# Patient Record
Sex: Male | Born: 1950 | Race: White | Hispanic: No | Marital: Single
Health system: Southern US, Community
[De-identification: ages and names within clinical notes are randomized; demographics above are authoritative.]

---

## 2016-03-02 ENCOUNTER — Inpatient Hospital Stay
Admission: RE | Admit: 2016-03-02 | Discharge: 2016-03-24 | Disposition: A | Discharge status: Skilled Nursing Facility | Payer: Self-pay | Source: Ambulatory Visit | Attending: Internal Medicine | Admitting: Internal Medicine

## 2016-03-02 DIAGNOSIS — N2889 Other specified disorders of kidney and ureter: Secondary | ICD-10-CM

## 2016-03-02 DIAGNOSIS — N179 Acute kidney failure, unspecified: Secondary | ICD-10-CM

## 2016-03-02 LAB — COMPREHENSIVE METABOLIC PANEL
ALK PHOS: 64 U/L (ref 38–126)
ALT: 11 U/L — AB (ref 17–63)
AST: 21 U/L (ref 15–41)
Albumin: 2.7 g/dL — ABNORMAL LOW (ref 3.5–5.0)
Anion gap: 8 (ref 5–15)
BUN: 35 mg/dL — AB (ref 6–20)
CALCIUM: 9.1 mg/dL (ref 8.9–10.3)
CO2: 31 mmol/L (ref 22–32)
CREATININE: 3.27 mg/dL — AB (ref 0.61–1.24)
Chloride: 98 mmol/L — ABNORMAL LOW (ref 101–111)
GFR calc Af Amer: 21 mL/min — ABNORMAL LOW (ref >60)
GFR calc non Af Amer: 18 mL/min — ABNORMAL LOW (ref >60)
GLUCOSE: 175 mg/dL — AB (ref 65–99)
Potassium: 4 mmol/L (ref 3.5–5.1)
SODIUM: 137 mmol/L (ref 135–145)
Total Bilirubin: 0.6 mg/dL (ref 0.3–1.2)
Total Protein: 6.4 g/dL — ABNORMAL LOW (ref 6.5–8.1)

## 2016-03-03 LAB — CBC WITH DIFFERENTIAL/PLATELET
BASOS PCT: 1 %
Basophils Absolute: 0.1 10*3/uL (ref 0.0–0.1)
EOS ABS: 0.3 10*3/uL (ref 0.0–0.7)
Eosinophils Relative: 4 %
HCT: 19.9 % — ABNORMAL LOW (ref 39.0–52.0)
Hemoglobin: 5.8 g/dL — CL (ref 13.0–17.0)
LYMPHS ABS: 0.9 10*3/uL (ref 0.7–4.0)
Lymphocytes Relative: 15 %
MCH: 27 pg (ref 26.0–34.0)
MCHC: 29.1 g/dL — AB (ref 30.0–36.0)
MCV: 92.6 fL (ref 78.0–100.0)
MONO ABS: 0.6 10*3/uL (ref 0.1–1.0)
MONOS PCT: 10 %
Neutro Abs: 4.3 10*3/uL (ref 1.7–7.7)
Neutrophils Relative %: 70 %
Platelets: 238 10*3/uL (ref 150–400)
RBC: 2.15 MIL/uL — ABNORMAL LOW (ref 4.22–5.81)
RDW: 17.5 % — AB (ref 11.5–15.5)
WBC: 6.1 10*3/uL (ref 4.0–10.5)

## 2016-03-03 LAB — CBC
HEMATOCRIT: 25.3 % — AB (ref 39.0–52.0)
Hemoglobin: 7.4 g/dL — ABNORMAL LOW (ref 13.0–17.0)
MCH: 26.6 pg (ref 26.0–34.0)
MCHC: 29.2 g/dL — ABNORMAL LOW (ref 30.0–36.0)
MCV: 91 fL (ref 78.0–100.0)
Platelets: 232 10*3/uL (ref 150–400)
RBC: 2.78 MIL/uL — ABNORMAL LOW (ref 4.22–5.81)
RDW: 17.6 % — AB (ref 11.5–15.5)
WBC: 5.1 10*3/uL (ref 4.0–10.5)

## 2016-03-04 LAB — URINE MICROSCOPIC-ADD ON: RBC / HPF: NONE SEEN RBC/hpf (ref 0–5)

## 2016-03-04 LAB — COMPREHENSIVE METABOLIC PANEL
ALT: 10 U/L — ABNORMAL LOW (ref 17–63)
ANION GAP: 6 (ref 5–15)
AST: 18 U/L (ref 15–41)
Albumin: 2.8 g/dL — ABNORMAL LOW (ref 3.5–5.0)
Alkaline Phosphatase: 58 U/L (ref 38–126)
BUN: 33 mg/dL — AB (ref 6–20)
CHLORIDE: 102 mmol/L (ref 101–111)
CO2: 32 mmol/L (ref 22–32)
Calcium: 9.5 mg/dL (ref 8.9–10.3)
Creatinine, Ser: 3 mg/dL — ABNORMAL HIGH (ref 0.61–1.24)
GFR, EST AFRICAN AMERICAN: 24 mL/min — AB (ref >60)
GFR, EST NON AFRICAN AMERICAN: 20 mL/min — AB (ref >60)
Glucose, Bld: 132 mg/dL — ABNORMAL HIGH (ref 65–99)
POTASSIUM: 4.5 mmol/L (ref 3.5–5.1)
Sodium: 140 mmol/L (ref 135–145)
Total Bilirubin: 0.5 mg/dL (ref 0.3–1.2)
Total Protein: 6.5 g/dL (ref 6.5–8.1)

## 2016-03-04 LAB — URINALYSIS, ROUTINE W REFLEX MICROSCOPIC
Bilirubin Urine: NEGATIVE
Glucose, UA: NEGATIVE mg/dL
Hgb urine dipstick: NEGATIVE
Ketones, ur: NEGATIVE mg/dL
LEUKOCYTES UA: NEGATIVE
NITRITE: NEGATIVE
SPECIFIC GRAVITY, URINE: 1.012 (ref 1.005–1.030)
pH: 6.5 (ref 5.0–8.0)

## 2016-03-05 ENCOUNTER — Other Ambulatory Visit (HOSPITAL_COMMUNITY): Payer: Self-pay

## 2016-03-05 LAB — BASIC METABOLIC PANEL
Anion gap: 7 (ref 5–15)
BUN: 32 mg/dL — AB (ref 6–20)
CHLORIDE: 101 mmol/L (ref 101–111)
CO2: 31 mmol/L (ref 22–32)
Calcium: 9.8 mg/dL (ref 8.9–10.3)
Creatinine, Ser: 3.04 mg/dL — ABNORMAL HIGH (ref 0.61–1.24)
GFR calc Af Amer: 23 mL/min — ABNORMAL LOW (ref >60)
GFR calc non Af Amer: 20 mL/min — ABNORMAL LOW (ref >60)
GLUCOSE: 211 mg/dL — AB (ref 65–99)
POTASSIUM: 4.9 mmol/L (ref 3.5–5.1)
Sodium: 139 mmol/L (ref 135–145)

## 2016-03-05 LAB — URINE CULTURE: CULTURE: NO GROWTH

## 2016-03-05 NOTE — Consult Note (Signed)
[facility] CONSULT NOTE    Date: 03/05/2016                  Patient Name:  Alan Lee  MRN: [id number]  DOB: [birth year]  Age / Sex: 65 y.o., male         PCP: No primary care provider on file.                 Service Requesting Consult: Dr. Odis Luster                 Reason for Consult: Acute renal failure, nephrotic range proteinuria, CKD stage III            History of Present Illness: Patient is a 65 y.o. male with a PMHx of chronic kidney disease stage III baseline creatinine 1.5-1.8, diabetes mellitus type 2 greater than 10 years, coronary artery disease, hypertension, hyperlipidemia, chronic pain syndrome, anemia of chronic kidney disease, partial amputation of the left foot, who was admitted to [facility] on 03/02/2016 for ongoing treatment of multiple medical problems. The patient was at [facility] from August 20 17th to August 1 03/24/2016. The patient unfortunately has had 8 hospital admissions in 2017. Most recently he was treated for MRSA septicemia. Recently he was treated with daptomycin and ceftaroline.  Echocardiogram was apparently negative for vegetations. 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 He underwent CT-guided aspiration of this is no neurosurgical intervention was recommended.  Patient also apparently had nephrotic range proteinuria which is felt to be the source of his anasarca. The cause of his nephrotic syndrome is not clearly delineated per records provided to [place]. However as above the patient has long-standing diabetes mellitus type 2. In addition he's been malnourished with albumin as low as 2.8 recently. The patient did receive diuresis at the outside facility which unfortunately worsened his renal function. Patient also relates that he received IV contrast which may be playing a role in his acute renal failure.   Medications: Outpatient medications:   Current  medications: Teflaro 300mg  IV BID, Humalog insulin, aspirin 81 mg daily, Lipitor 80 mg each bedtime, bumetanide 1 mg daily, chloride 25 mg twice a day, Plavix 75 mg daily, Benadryl 25 mg twice a day, Pepcid 20 mg twice a day, fenofibrate 160 mg daily, ferrous sulfate 324 mg twice a day, Neurontin 600 mg twice a day, heparin 5000 units subcutaneous every 8 hours, hydralazine 100 mg 3 times a day, isosorbide mononitrate 90 mg every morning, Lantus 15 units daily, lisinopril 2.5 mg twice a day, morphine 50 mg twice a day, potassium chloride 10 mEq every other day, protein supplement, multivitamin 1 tablet daily, vitamin C 500 mg twice a day, zinc sulfate 220 mg daily  Allergies: ciprofloxacin   Past Medical History: chronic kidney disease stage III baseline creatinine 1.5-1.8, diabetes mellitus type 2 greater than 10 years, coronary artery disease, hypertension, hyperlipidemia, chronic pain syndrome, anemia of chronic kidney disease, partial amputation of the left foot,  Past Surgical History: No past surgical history on file.   Family History: Father with history of hyperlipidemia and hypertension. Mother had history of diabetes mellitus  Social History: Patient is single and has 5 children. He lives in [place]. He used to smoke. Denies alcohol illicit drug use.   Review of Systems: Review of Systems  Constitutional: Negative for chills, fever and weight loss.  HENT: Negative for hearing loss and tinnitus.  Eyes: Negative for blurred vision and double vision.  Respiratory: Negative for cough, hemoptysis and sputum production.   Cardiovascular: Positive for chest pain and leg swelling. Negative for palpitations, orthopnea and claudication.  Gastrointestinal: Negative for heartburn, nausea and vomiting.  Genitourinary: Negative for dysuria, frequency and urgency.  Musculoskeletal: Positive for back pain.  Skin: Negative for itching.  Neurological: Negative for dizziness,  focal weakness and headaches.  Endo/Heme/Allergies: Negative for polydipsia. Does not bruise/bleed easily.  Psychiatric/Behavioral: Negative for depression. The patient is not nervous/anxious.      Vital Signs: There were no vitals taken for this visit.  Weight trends: There were no vitals filed for this visit.  Physical Exam: General: NAD,   Head: Normocephalic, atraumatic.  Eyes: Anicteric, EOMI  Nose: Mucous membranes moist, not inflammed, nonerythematous.  Throat: Oropharynx nonerythematous, no exudate appreciated.   Neck: Supple, trachea midline  Lungs:  Diminished at bases otherwise clear  Heart: S1S2 no rubs  Abdomen:  BS normoactive. Soft, Nondistended, non-tender.  No masses or organomegaly.  Extremities: L > R LE edema  Neurologic: A&O X3, Motor strength is 5/5 in the all 4 extremities  Skin: No visible rashes, scars.    Lab results: Basic Metabolic Panel:  Recent Labs Lab 03/02/16 2240 03/04/16 0735 03/05/16 1122  NA 137 140 139  K 4.0 4.5 4.9  CL 98* 102 101  CO2 31 32 31  GLUCOSE 175* 132* 211*  BUN 35* 33* 32*  CREATININE 3.27* 3.00* 3.04*  CALCIUM 9.1 9.5 9.8    Liver Function Tests:  Recent Labs Lab 03/02/16 2240 03/04/16 0735  AST 21 18  ALT 11* 10*  ALKPHOS 64 58  BILITOT 0.6 0.5  PROT 6.4* 6.5  ALBUMIN 2.7* 2.8*   No results for input(s): LIPASE, AMYLASE in the last 168 hours. No results for input(s): AMMONIA in the last 168 hours.  CBC:  Recent Labs Lab 03/02/16 2240 03/03/16 0100  WBC 6.1 5.1  NEUTROABS 4.3  --   HGB 5.8* 7.4*  HCT 19.9* 25.3*  MCV 92.6 91.0  PLT 238 232    Cardiac Enzymes: No results for input(s): CKTOTAL, CKMB, CKMBINDEX, TROPONINI in the last 168 hours.  BNP: Invalid input(s): POCBNP  CBG: No results for input(s): GLUCAP in the last 168 hours.  Microbiology: Results for orders placed or performed during the hospital encounter of 03/02/16  Culture, Urine     Status: None   Collection Time:  03/04/16 12:03 PM  Result Value Ref Range Status   Specimen Description URINE, RANDOM  Final   Special Requests NONE  Final   Culture NO GROWTH  Final   Report Status 03/05/2016 FINAL  Final    Coagulation Studies: No results for input(s): LABPROT, INR in the last 72 hours.  Urinalysis:  Recent Labs  03/04/16 1203  COLORURINE YELLOW  LABSPEC 1.012  PHURINE 6.5  GLUCOSEU NEGATIVE  HGBUR NEGATIVE  BILIRUBINUR NEGATIVE  KETONESUR NEGATIVE  PROTEINUR >300*  NITRITE NEGATIVE  LEUKOCYTESUR NEGATIVE      Imaging:  No results found.   Assessment & Plan: Pt is a 65 y.o. male PMHx of chronic kidney disease stage III baseline creatinine 1.5-1.8, diabetes mellitus type 2 greater than 10 years, coronary artery disease, hypertension, hyperlipidemia, chronic pain syndrome, anemia of chronic kidney disease, partial amputation of the left foot, who [facility] for ongoing treatment of multiple medical problems. The [facility] from August 20 17th to August 1 03/24/2016. The  patient unfortunately has had 8 hospital admissions in 2017. Most recently he was treated for MRSA septicemia. Recently he was treated with daptomycin and ceftaroline.    1.  Acute renal failure, multifactorial with contributions from aggressive diuresis, recent contrast exposure, and sepsis. 2.  CKD stage III baseline Cr 1.5-1.8 reported in records, unclear if this is his recent baseline however. 3.  Nephrotic range proteinuria of undetermined etiology, however pt with long standing DM-2 4.  Anemia of CKD.  5.  MRSA sepsis on treatment teflaro.  Plan: The patient has quite a complex recent past medical history. He has recent acute renal failure with creatinine being in the low 3 range. His baseline creatinine is not entirely known however records from the outside hospital mention a baseline creatinine of 1.5-1.8 however this appears to be have documented from 2014. It appears  that his more recent baseline has been in the 2s.  He also has underlying nephrotic syndrome in the setting of known diabetes mellitus type 2. The most likely etiology of his nephrotic syndrome is diabetes mellitus type 2. We will check SPEP and UPEP for additional workup. We will also check a renal ultrasound to make sure there is no underlying obstruction. We would advise against renal biopsy at this time given recent MRSA bacteremia. Continue diuresis for now but monitor renal function closely while doing this. Thanks for consultation.

## 2016-03-06 LAB — BASIC METABOLIC PANEL
Anion gap: 8 (ref 5–15)
BUN: 36 mg/dL — ABNORMAL HIGH (ref 6–20)
CHLORIDE: 100 mmol/L — AB (ref 101–111)
CO2: 31 mmol/L (ref 22–32)
CREATININE: 2.85 mg/dL — AB (ref 0.61–1.24)
Calcium: 10 mg/dL (ref 8.9–10.3)
GFR calc non Af Amer: 22 mL/min — ABNORMAL LOW (ref >60)
GFR, EST AFRICAN AMERICAN: 25 mL/min — AB (ref >60)
Glucose, Bld: 120 mg/dL — ABNORMAL HIGH (ref 65–99)
POTASSIUM: 4.2 mmol/L (ref 3.5–5.1)
SODIUM: 139 mmol/L (ref 135–145)

## 2016-03-07 LAB — CBC WITH DIFFERENTIAL/PLATELET
Basophils Absolute: 0.1 10*3/uL (ref 0.0–0.1)
Basophils Relative: 2 %
Eosinophils Absolute: 0.3 10*3/uL (ref 0.0–0.7)
Eosinophils Relative: 6 %
HEMATOCRIT: 29.5 % — AB (ref 39.0–52.0)
HEMOGLOBIN: 8.4 g/dL — AB (ref 13.0–17.0)
LYMPHS ABS: 0.9 10*3/uL (ref 0.7–4.0)
LYMPHS PCT: 19 %
MCH: 26.2 pg (ref 26.0–34.0)
MCHC: 28.5 g/dL — AB (ref 30.0–36.0)
MCV: 91.9 fL (ref 78.0–100.0)
MONOS PCT: 9 %
Monocytes Absolute: 0.5 10*3/uL (ref 0.1–1.0)
NEUTROS PCT: 64 %
Neutro Abs: 3.3 10*3/uL (ref 1.7–7.7)
Platelets: 243 10*3/uL (ref 150–400)
RBC: 3.21 MIL/uL — AB (ref 4.22–5.81)
RDW: 17.4 % — ABNORMAL HIGH (ref 11.5–15.5)
WBC: 5 10*3/uL (ref 4.0–10.5)

## 2016-03-07 LAB — BASIC METABOLIC PANEL
Anion gap: 9 (ref 5–15)
BUN: 42 mg/dL — AB (ref 6–20)
CHLORIDE: 99 mmol/L — AB (ref 101–111)
CO2: 30 mmol/L (ref 22–32)
CREATININE: 3.18 mg/dL — AB (ref 0.61–1.24)
Calcium: 9.7 mg/dL (ref 8.9–10.3)
GFR calc Af Amer: 22 mL/min — ABNORMAL LOW (ref >60)
GFR calc non Af Amer: 19 mL/min — ABNORMAL LOW (ref >60)
GLUCOSE: 172 mg/dL — AB (ref 65–99)
POTASSIUM: 4.1 mmol/L (ref 3.5–5.1)
Sodium: 138 mmol/L (ref 135–145)

## 2016-03-08 LAB — BASIC METABOLIC PANEL
Anion gap: 8 (ref 5–15)
BUN: 42 mg/dL — AB (ref 6–20)
CHLORIDE: 102 mmol/L (ref 101–111)
CO2: 30 mmol/L (ref 22–32)
Calcium: 9.5 mg/dL (ref 8.9–10.3)
Creatinine, Ser: 2.94 mg/dL — ABNORMAL HIGH (ref 0.61–1.24)
GFR calc Af Amer: 24 mL/min — ABNORMAL LOW (ref >60)
GFR calc non Af Amer: 21 mL/min — ABNORMAL LOW (ref >60)
GLUCOSE: 126 mg/dL — AB (ref 65–99)
POTASSIUM: 4.3 mmol/L (ref 3.5–5.1)
SODIUM: 140 mmol/L (ref 135–145)

## 2016-03-08 NOTE — Progress Notes (Signed)
  Subjective:  Patient is doing fair today Voiding without problem And output 2150 cc last 24 hrs   Objective:  Vital signs in last 24 hours:  Temperature 96.9, pulse 70, blood pressure 148/73, respirations 20  Intake/Output:   No intake or output data in the 24 hours ending 03/08/16 1544   Physical Exam: General: No acute distress, laying in the bed  HEENT Anicteric, moist mucous membranes  Neck Supple, no masses  Pulm/lungs Normal breathing effort, clear to auscultation  CVS/Heart no rub or gallop  Abdomen:  Soft, nontender, nondistended  Extremities: Amputations noted in feet bilaterally, trace edema  Neurologic: Alert, oriented, speech normal  Skin: No acute rashes          Basic Metabolic Panel:   Recent Labs Lab 03/04/16 0735 03/05/16 1122 03/06/16 0627 03/07/16 0510 03/08/16 0500  NA 140 139 139 138 140  K 4.5 4.9 4.2 4.1 4.3  CL 102 101 100* 99* 102  CO2 32 31 31 30 30   GLUCOSE 132* 211* 120* 172* 126*  BUN 33* 32* 36* 42* 42*  CREATININE 3.00* 3.04* 2.85* 3.18* 2.94*  CALCIUM 9.5 9.8 10.0 9.7 9.5     CBC:  Recent Labs Lab 03/02/16 2240 03/03/16 0100 03/07/16 0510  WBC 6.1 5.1 5.0  NEUTROABS 4.3  --  3.3  HGB 5.8* 7.4* 8.4*  HCT 19.9* 25.3* 29.5*  MCV 92.6 91.0 91.9  PLT 238 232 243      Microbiology:  Recent Results (from the past 720 hour(s))  Culture, Urine     Status: None   Collection Time: 03/04/16 12:03 PM  Result Value Ref Range Status   Specimen Description URINE, RANDOM  Final   Special Requests NONE  Final   Culture NO GROWTH  Final   Report Status 03/05/2016 FINAL  Final    Coagulation Studies: No results for input(s): LABPROT, INR in the last 72 hours.  Urinalysis: No results for input(s): COLORURINE, LABSPEC, PHURINE, GLUCOSEU, HGBUR, BILIRUBINUR, KETONESUR, PROTEINUR, UROBILINOGEN, NITRITE, LEUKOCYTESUR in the last 72 hours.  Invalid input(s): APPERANCEUR    Imaging: No results found.   Medications:    Bumetanide 1 mg daily Carvedilol 25 mg twice a day Hydralazine 100 mg 3 times per day Lisinopril 2.5 mg twice a day Potassium chloride 10 mEq every other day   Assessment/ Plan:  65 y.o.caucasian male with chronic kidney disease stage III baseline creatinine 1.5-1.8, diabetes mellitus type 2 > 10 years, coronary artery disease, hypertension, hyperlipidemia, chronic pain syndrome, anemia of chronic kidney disease, partial amputation of the left foot, who [facility] for ongoing treatment of multiple medical problems. [facility] was [facility] . The patient unfortunately has had 8 hospital admissions in 2017. Most recently he was treated for MRSA septicemia. Recently he was treated with daptomycin and ceftaroline.    1.  Acute renal failure, multifactorial with contributions from aggressive diuresis, recent contrast exposure, and sepsis. 2.  CKD stage III baseline Cr 1.5-1.8 reported in records, unclear if this is his recent baseline however. 3.  Nephrotic range proteinuria of undetermined etiology, however pt with long standing DM-2 4.  Anemia of CKD.  5.  MRSA sepsis on treatment teflaro.  Patient is currently getting bumetanide 1 mg daily Serum creatinine has improved slightly to 2.94 today We will continue to monitor    LOS: 0 Chigozie Basaldua 9/4/20173:44 PM

## 2016-03-09 LAB — BASIC METABOLIC PANEL
Anion gap: 9 (ref 5–15)
BUN: 41 mg/dL — AB (ref 6–20)
CALCIUM: 9.7 mg/dL (ref 8.9–10.3)
CO2: 30 mmol/L (ref 22–32)
CREATININE: 2.99 mg/dL — AB (ref 0.61–1.24)
Chloride: 102 mmol/L (ref 101–111)
GFR calc Af Amer: 24 mL/min — ABNORMAL LOW (ref >60)
GFR calc non Af Amer: 20 mL/min — ABNORMAL LOW (ref >60)
GLUCOSE: 129 mg/dL — AB (ref 65–99)
Potassium: 4 mmol/L (ref 3.5–5.1)
Sodium: 141 mmol/L (ref 135–145)

## 2016-03-10 ENCOUNTER — Other Ambulatory Visit (HOSPITAL_COMMUNITY): Payer: Self-pay

## 2016-03-10 LAB — RENAL FUNCTION PANEL
ALBUMIN: 2.9 g/dL — AB (ref 3.5–5.0)
ANION GAP: 14 (ref 5–15)
BUN: 42 mg/dL — AB (ref 6–20)
CHLORIDE: 97 mmol/L — AB (ref 101–111)
CO2: 28 mmol/L (ref 22–32)
Calcium: 9.3 mg/dL (ref 8.9–10.3)
Creatinine, Ser: 3.04 mg/dL — ABNORMAL HIGH (ref 0.61–1.24)
GFR calc Af Amer: 23 mL/min — ABNORMAL LOW (ref >60)
GFR calc non Af Amer: 20 mL/min — ABNORMAL LOW (ref >60)
GLUCOSE: 145 mg/dL — AB (ref 65–99)
POTASSIUM: 4 mmol/L (ref 3.5–5.1)
Phosphorus: 4 mg/dL (ref 2.5–4.6)
Sodium: 139 mmol/L (ref 135–145)

## 2016-03-10 NOTE — Progress Notes (Signed)
Sent for pt and per transport patients birthday is wrong on arm band, floor to get a correct band for pt and call mri back to come get patient

## 2016-03-10 NOTE — Progress Notes (Signed)
Subjective:  Patient is doing fair today Voiding without problem  renal u.s revealed 1.7 cm kidney mass   Objective:  Vital signs in last 24 hours:  Temperature 97.1, pulse 63, respirations 20, blood pressure 138/78  Intake/Output:   No intake or output data in the 24 hours ending 03/10/16 1556   Physical Exam: General: No acute distress, laying in the bed  HEENT Anicteric, moist mucous membranes  Neck Supple, no masses  Pulm/lungs Normal breathing effort, clear to auscultation  CVS/Heart no rub or gallop  Abdomen:  Soft, nontender, nondistended  Extremities: Amputations noted in feet bilaterally, trace edema  Neurologic: Alert, oriented, speech normal  Skin: No acute rashes          Basic Metabolic Panel:   Recent Labs Lab 03/06/16 0627 03/07/16 0510 03/08/16 0500 03/09/16 0630 03/10/16 0500  NA 139 138 140 141 139  K 4.2 4.1 4.3 4.0 4.0  CL 100* 99* 102 102 97*  CO2 31 30 30 30 28   GLUCOSE 120* 172* 126* 129* 145*  BUN 36* 42* 42* 41* 42*  CREATININE 2.85* 3.18* 2.94* 2.99* 3.04*  CALCIUM 10.0 9.7 9.5 9.7 9.3  PHOS  --   --   --   --  4.0     CBC:  Recent Labs Lab 03/07/16 0510  WBC 5.0  NEUTROABS 3.3  HGB 8.4*  HCT 29.5*  MCV 91.9  PLT 243      Microbiology:  Recent Results (from the past 720 hour(s))  Culture, Urine     Status: None   Collection Time: 03/04/16 12:03 PM  Result Value Ref Range Status   Specimen Description URINE, RANDOM  Final   Special Requests NONE  Final   Culture NO GROWTH  Final   Report Status 03/05/2016 FINAL  Final    Coagulation Studies: No results for input(s): LABPROT, INR in the last 72 hours.  Urinalysis: No results for input(s): COLORURINE, LABSPEC, PHURINE, GLUCOSEU, HGBUR, BILIRUBINUR, KETONESUR, PROTEINUR, UROBILINOGEN, NITRITE, LEUKOCYTESUR in the last 72 hours.  Invalid input(s): APPERANCEUR    Imaging: No results found.   Medications:   Bumetanide 1 mg daily Carvedilol 25 mg twice a  day Hydralazine 100 mg 3 times per day Lisinopril 2.5 mg twice a day Potassium chloride 10 mEq every other day   Assessment/ Plan:  65 y.o.caucasian male with chronic kidney disease stage III baseline creatinine 1.5-1.8, diabetes mellitus type 2 > 10 years, coronary artery disease, hypertension, hyperlipidemia, chronic pain syndrome, anemia of chronic kidney disease, partial amputation of the left foot, who [facility] for ongoing treatment of multiple medical problems. The [facility] was [facility] . The patient unfortunately has had 8 hospital admissions in 2017. Most recently he was treated for MRSA septicemia. Recently he was treated with daptomycin and ceftaroline.    1.  Acute renal failure, multifactorial with contributions from aggressive diuresis, recent contrast exposure, and sepsis. 2.  CKD stage III baseline Cr 1.5-1.8 reported in records, unclear if this is his recent baseline however. 3.  Nephrotic range proteinuria of undetermined etiology, however pt with long standing DM-2 4.  Anemia of CKD.  5.  MRSA sepsis on treatment teflaro. 6. 1.7 cm left kidney mass. MRI pending  Patient is currently getting bumetanide 1 mg daily Serum creatinine remains about the same. GFR 20 Electrolytes and volume status are acceptable. No acute indication for dialysis We will continue to monitor    LOS: 0 Aubriegh Minch 9/6/20173:56 PM

## 2016-03-11 ENCOUNTER — Other Ambulatory Visit (HOSPITAL_COMMUNITY): Payer: Self-pay

## 2016-03-13 LAB — BASIC METABOLIC PANEL
Anion gap: 7 (ref 5–15)
BUN: 23 mg/dL — AB (ref 6–20)
CALCIUM: 8.4 mg/dL — AB (ref 8.9–10.3)
CHLORIDE: 102 mmol/L (ref 101–111)
CO2: 27 mmol/L (ref 22–32)
CREATININE: 0.9 mg/dL (ref 0.61–1.24)
Glucose, Bld: 79 mg/dL (ref 65–99)
Potassium: 4 mmol/L (ref 3.5–5.1)
SODIUM: 136 mmol/L (ref 135–145)

## 2016-03-15 NOTE — Progress Notes (Signed)
  Subjective:  MRI of the abdomen did not reveal cyst that was previously noted on ultrasound. Renal function has normalized. Overall patient doing well and ambulating periodically with a walker.   Objective:  Vital signs in last 24 hours:  Temperature 98.1 pulse 64 respirations 18 blood pressure 145/80   Physical Exam: General: No acute distress, laying in the bed  HEENT Anicteric, moist mucous membranes  Neck Supple  Pulm/lungs Normal breathing effort, clear to auscultation  CVS/Heart no rub or gallop  Abdomen:  Soft, nontender, nondistended  Extremities: Amputations noted in feet bilaterally, trace edema  Neurologic: Alert, oriented, speech normal  Skin: No acute rashes          Basic Metabolic Panel:   Recent Labs Lab 03/09/16 0630 03/10/16 0500 03/13/16 0630  NA 141 139 136  K 4.0 4.0 4.0  CL 102 97* 102  CO2 30 28 27   GLUCOSE 129* 145* 79  BUN 41* 42* 23*  CREATININE 2.99* 3.04* 0.90  CALCIUM 9.7 9.3 8.4*  PHOS  --  4.0  --      CBC: No results for input(s): WBC, NEUTROABS, HGB, HCT, MCV, PLT in the last 168 hours.    Microbiology:  Recent Results (from the past 720 hour(s))  Culture, Urine     Status: None   Collection Time: 03/04/16 12:03 PM  Result Value Ref Range Status   Specimen Description URINE, RANDOM  Final   Special Requests NONE  Final   Culture NO GROWTH  Final   Report Status 03/05/2016 FINAL  Final    Coagulation Studies: No results for input(s): LABPROT, INR in the last 72 hours.  Urinalysis: No results for input(s): COLORURINE, LABSPEC, PHURINE, GLUCOSEU, HGBUR, BILIRUBINUR, KETONESUR, PROTEINUR, UROBILINOGEN, NITRITE, LEUKOCYTESUR in the last 72 hours.  Invalid input(s): APPERANCEUR    Imaging: No results found.   Medications:   Bumetanide 1 mg daily Carvedilol 25 mg twice a day Hydralazine 100 mg 3 times per day Lisinopril 2.5 mg twice a day Potassium chloride 10 mEq every other day   Assessment/ Plan:  65  y.o.caucasian male with chronic kidney disease stage III baseline creatinine 1.5-1.8, diabetes mellitus type 2 > 10 years, coronary artery disease, hypertension, hyperlipidemia, chronic pain syndrome, anemia of chronic kidney disease, partial amputation of the left foot, who [facility] for ongoing treatment of multiple medical problems. [facility] The patient unfortunately has had 8 hospital admissions in 2017. Most recently he was treated for MRSA septicemia. Recently he was treated with daptomycin and ceftaroline.    1.  Acute renal failure, multifactorial with contributions from aggressive diuresis, recent contrast exposure, and sepsis. 2.  CKD stage III baseline Cr 1.5-1.8 reported in records, unclear if this is his recent baseline however. 3.  Nephrotic range proteinuria of undetermined etiology, however pt with long standing DM-2 4.  Anemia of CKD.  5.  MRSA sepsis on treatment teflaro. 6. 1.7 cm left kidney mass on US, however MRI abd negative.  Plan: It appears that the patient's episode of acute renal failure has improved.  Creatinine currently 0.9. Patient does have underlying proteinuria however.  We will reorder SPEP and UPEP.  Hemoglobin remains low at 8.4. Aranesp could be potentially considered as an outpatient. Overall patient appears to be improving slowly.    LOS: 0 Katlin Ciszewski 9/11/20173:24 PM

## 2016-03-17 LAB — BASIC METABOLIC PANEL
ANION GAP: 8 (ref 5–15)
BUN: 44 mg/dL — ABNORMAL HIGH (ref 6–20)
CHLORIDE: 102 mmol/L (ref 101–111)
CO2: 27 mmol/L (ref 22–32)
CREATININE: 3.28 mg/dL — AB (ref 0.61–1.24)
Calcium: 9.4 mg/dL (ref 8.9–10.3)
GFR calc non Af Amer: 18 mL/min — ABNORMAL LOW (ref >60)
GFR, EST AFRICAN AMERICAN: 21 mL/min — AB (ref >60)
Glucose, Bld: 122 mg/dL — ABNORMAL HIGH (ref 65–99)
POTASSIUM: 4.1 mmol/L (ref 3.5–5.1)
SODIUM: 137 mmol/L (ref 135–145)

## 2016-03-17 NOTE — Progress Notes (Signed)
  Subjective:  Patient overall appears to be feeling better. However BUN and creatinine are higher at 44 and 3.28 respectively.   Objective:  Vital signs in last 24 hours:  Temperature 98.1 pulse 65 respirations 18 blood pressure 151/82   Physical Exam: General: No acute distress, laying in the bed  HEENT Anicteric, moist mucous membranes  Neck Supple  Pulm/lungs Normal breathing effort, clear to auscultation  CVS/Heart no rub or gallop  Abdomen:  Soft, nontender, nondistended  Extremities: Amputations noted in feet bilaterally, trace edema  Neurologic: Alert, oriented, speech normal  Skin: No acute rashes          Basic Metabolic Panel:   Recent Labs Lab 03/13/16 0630 03/17/16 0521  NA 136 137  K 4.0 4.1  CL 102 102  CO2 27 27  GLUCOSE 79 122*  BUN 23* 44*  CREATININE 0.90 3.28*  CALCIUM 8.4* 9.4     CBC: No results for input(s): WBC, NEUTROABS, HGB, HCT, MCV, PLT in the last 168 hours.    Microbiology:  Recent Results (from the past 720 hour(s))  Culture, Urine     Status: None   Collection Time: 03/04/16 12:03 PM  Result Value Ref Range Status   Specimen Description URINE, RANDOM  Final   Special Requests NONE  Final   Culture NO GROWTH  Final   Report Status 03/05/2016 FINAL  Final    Coagulation Studies: No results for input(s): LABPROT, INR in the last 72 hours.  Urinalysis: No results for input(s): COLORURINE, LABSPEC, PHURINE, GLUCOSEU, HGBUR, BILIRUBINUR, KETONESUR, PROTEINUR, UROBILINOGEN, NITRITE, LEUKOCYTESUR in the last 72 hours.  Invalid input(s): APPERANCEUR    Imaging: No results found.   Medications:   Bumetanide 1 mg daily Carvedilol 25 mg twice a day Hydralazine 100 mg 3 times per day Lisinopril 2.5 mg twice a day Potassium chloride 10 mEq every other day   Assessment/ Plan:  65 y.o.caucasian male with chronic kidney disease stage III baseline creatinine 1.5-1.8, diabetes mellitus type 2 > 10 years, coronary artery  disease, hypertension, hyperlipidemia, chronic pain syndrome, anemia of chronic kidney disease, partial amputation of the left foot, who [facility] for ongoing treatment of multiple medical problems. [facility] . The patient unfortunately has had 8 hospital admissions in 2017. Most recently he was treated for MRSA septicemia. Recently he was treated with daptomycin and ceftaroline.    1.  Acute renal failure, multifactorial with contributions from aggressive diuresis, recent contrast exposure, and sepsis. 2.  CKD stage III baseline Cr 1.5-1.8 reported in records, unclear if this is his recent baseline however. 3.  Nephrotic range proteinuria of undetermined etiology, however pt with long standing DM-2 4.  Anemia of CKD.  5.  MRSA sepsis on treatment teflaro. 6. 1.7 cm left kidney mass on US, however MRI abd negative.  Plan: Renal function worse again. BUN up to 44 with a creatinine of 3.28.  We will stop bumetanide and start pt on NS at 50cc/hr x 72 hours.  Continue to monitor renal function periodically.  Pt expressed today that he's been having some thirst.  Will follow with you.   LOS: 0 Dawnisha Marquina 9/13/20174:29 PM

## 2016-03-19 LAB — RENAL FUNCTION PANEL
Albumin: 2.7 g/dL — ABNORMAL LOW (ref 3.5–5.0)
Anion gap: 5 (ref 5–15)
BUN: 29 mg/dL — AB (ref 6–20)
CHLORIDE: 106 mmol/L (ref 101–111)
CO2: 28 mmol/L (ref 22–32)
Calcium: 9.3 mg/dL (ref 8.9–10.3)
Creatinine, Ser: 2.47 mg/dL — ABNORMAL HIGH (ref 0.61–1.24)
GFR calc Af Amer: 30 mL/min — ABNORMAL LOW (ref >60)
GFR calc non Af Amer: 26 mL/min — ABNORMAL LOW (ref >60)
GLUCOSE: 153 mg/dL — AB (ref 65–99)
POTASSIUM: 4.5 mmol/L (ref 3.5–5.1)
Phosphorus: 3.8 mg/dL (ref 2.5–4.6)
Sodium: 139 mmol/L (ref 135–145)

## 2016-03-19 NOTE — Progress Notes (Signed)
  Subjective:  No new renal function testing today. At the last visit BUN was up to 44 with a creatinine of 3.2. We started the patient on IV fluid hydration.   Objective:  Vital signs in last 24 hours:  Temperature 98.0 pulse 64 respirations 12 blood pressure 153/85   Physical Exam: General: No acute distress, laying in the bed  HEENT Anicteric, moist mucous membranes  Neck Supple  Pulm/lungs Normal breathing effort, clear to auscultation  CVS/Heart no rub or gallop  Abdomen:  Soft, nontender, nondistended  Extremities: Amputations noted in feet bilaterally, trace edema  Neurologic: Alert, oriented, speech normal  Skin: No acute rashes          Basic Metabolic Panel:   Recent Labs Lab 03/13/16 0630 03/17/16 0521  NA 136 137  K 4.0 4.1  CL 102 102  CO2 27 27  GLUCOSE 79 122*  BUN 23* 44*  CREATININE 0.90 3.28*  CALCIUM 8.4* 9.4     CBC: No results for input(s): WBC, NEUTROABS, HGB, HCT, MCV, PLT in the last 168 hours.    Microbiology:  Recent Results (from the past 720 hour(s))  Culture, Urine     Status: None   Collection Time: 03/04/16 12:03 PM  Result Value Ref Range Status   Specimen Description URINE, RANDOM  Final   Special Requests NONE  Final   Culture NO GROWTH  Final   Report Status 03/05/2016 FINAL  Final    Coagulation Studies: No results for input(s): LABPROT, INR in the last 72 hours.  Urinalysis: No results for input(s): COLORURINE, LABSPEC, PHURINE, GLUCOSEU, HGBUR, BILIRUBINUR, KETONESUR, PROTEINUR, UROBILINOGEN, NITRITE, LEUKOCYTESUR in the last 72 hours.  Invalid input(s): APPERANCEUR    Imaging: No results found.   Medications:    Carvedilol 25 mg twice a day Hydralazine 100 mg 3 times per day Lisinopril 2.5 mg twice a day Potassium chloride 10 mEq every other day   Assessment/ Plan:  65 y.o.caucasian male with chronic kidney disease stage III baseline creatinine 1.5-1.8, diabetes mellitus type 2 > 10 years,  coronary artery disease, hypertension, hyperlipidemia, chronic pain syndrome, anemia of chronic kidney disease, partial amputation of the left foot, who [facility] for ongoing treatment of multiple medical problems. [facility] . The patient unfortunately has had 8 hospital admissions in 2017. Most recently he was treated for MRSA septicemia. Recently he was treated with daptomycin and ceftaroline.    1.  Acute renal failure, multifactorial with contributions from aggressive diuresis, recent contrast exposure, and sepsis. 2.  CKD stage III baseline Cr 1.5-1.8 reported in records, unclear if this is his recent baseline however. 3.  Nephrotic range proteinuria of undetermined etiology, however pt with long standing DM-2 4.  Anemia of CKD.  5.  MRSA sepsis on treatment teflaro. 6. 1.7 cm left kidney mass on US, however MRI abd negative.  Plan: No new renal function testing today. BUN was 44 with a creatinine of 3.28 at last measure. Continue IV fluid hydration for now. Continue to periodically monitor renal function. We will return Monday to reevaluate the patient's renal function. Bumetanide has been held now.   LOS: 0 Lesslie Mossa 9/15/20179:14 AM

## 2016-03-20 LAB — BASIC METABOLIC PANEL
Anion gap: 6 (ref 5–15)
BUN: 28 mg/dL — AB (ref 6–20)
CALCIUM: 8.9 mg/dL (ref 8.9–10.3)
CO2: 26 mmol/L (ref 22–32)
CREATININE: 2.33 mg/dL — AB (ref 0.61–1.24)
Chloride: 109 mmol/L (ref 101–111)
GFR calc Af Amer: 32 mL/min — ABNORMAL LOW (ref >60)
GFR, EST NON AFRICAN AMERICAN: 28 mL/min — AB (ref >60)
GLUCOSE: 130 mg/dL — AB (ref 65–99)
Potassium: 3.9 mmol/L (ref 3.5–5.1)
Sodium: 141 mmol/L (ref 135–145)

## 2016-03-22 LAB — RENAL FUNCTION PANEL
ALBUMIN: 2.9 g/dL — AB (ref 3.5–5.0)
Anion gap: 8 (ref 5–15)
BUN: 23 mg/dL — AB (ref 6–20)
CHLORIDE: 105 mmol/L (ref 101–111)
CO2: 26 mmol/L (ref 22–32)
Calcium: 9.4 mg/dL (ref 8.9–10.3)
Creatinine, Ser: 2.09 mg/dL — ABNORMAL HIGH (ref 0.61–1.24)
GFR calc Af Amer: 37 mL/min — ABNORMAL LOW (ref >60)
GFR calc non Af Amer: 32 mL/min — ABNORMAL LOW (ref >60)
GLUCOSE: 107 mg/dL — AB (ref 65–99)
POTASSIUM: 3.9 mmol/L (ref 3.5–5.1)
Phosphorus: 4.1 mg/dL (ref 2.5–4.6)
Sodium: 139 mmol/L (ref 135–145)

## 2016-03-22 NOTE — Progress Notes (Signed)
  Subjective:  Patient completed IV fluid hydration over the weekend. Resting in bed at the moment. Continues to complain of some back pain.   Objective:  Vital signs in last 24 hours:  Temperature 98.3 pulse 72 respirations 18 blood pressure 126/60   Physical Exam: General: No acute distress, laying in the bed  HEENT Anicteric, moist mucous membranes  Neck Supple  Pulm/lungs Normal breathing effort, clear to auscultation  CVS/Heart no rub or gallop  Abdomen:  Soft, nontender, nondistended  Extremities: Amputations noted in feet bilaterally, trace edema  Neurologic: Alert, oriented, speech normal  Skin: No acute rashes          Basic Metabolic Panel:   Recent Labs Lab 03/17/16 0521 03/19/16 1459 03/20/16 0554 03/22/16 0500  NA 137 139 141 139  K 4.1 4.5 3.9 3.9  CL 102 106 109 105  CO2 27 28 26 26   GLUCOSE 122* 153* 130* 107*  BUN 44* 29* 28* 23*  CREATININE 3.28* 2.47* 2.33* 2.09*  CALCIUM 9.4 9.3 8.9 9.4  PHOS  --  3.8  --  4.1     CBC: No results for input(s): WBC, NEUTROABS, HGB, HCT, MCV, PLT in the last 168 hours.    Microbiology:  Recent Results (from the past 720 hour(s))  Culture, Urine     Status: None   Collection Time: 03/04/16 12:03 PM  Result Value Ref Range Status   Specimen Description URINE, RANDOM  Final   Special Requests NONE  Final   Culture NO GROWTH  Final   Report Status 03/05/2016 FINAL  Final    Coagulation Studies: No results for input(s): LABPROT, INR in the last 72 hours.  Urinalysis: No results for input(s): COLORURINE, LABSPEC, PHURINE, GLUCOSEU, HGBUR, BILIRUBINUR, KETONESUR, PROTEINUR, UROBILINOGEN, NITRITE, LEUKOCYTESUR in the last 72 hours.  Invalid input(s): APPERANCEUR    Imaging: No results found.   Medications:    Carvedilol 25 mg twice a day Hydralazine 100 mg 3 times per day Lisinopril 2.5 mg twice a day Potassium chloride 10 mEq every other day   Assessment/ Plan:  65 y.o.caucasian male  with chronic kidney disease stage III baseline creatinine 1.5-1.8, diabetes mellitus type 2 > 10 years, coronary artery disease, hypertension, hyperlipidemia, chronic pain syndrome, anemia of chronic kidney disease, partial amputation of the left foot, who was [facility]02[facility] for ongoing treatment of multiple medical problems. [facility] . The patient unfortunately has had 8 hospital admissions in 2017. Most recently he was treated for MRSA septicemia. Recently he was treated with daptomycin and ceftaroline.    1.  Acute renal failure, multifactorial with contributions from aggressive diuresis, recent contrast exposure, and sepsis. 2.  CKD stage III baseline Cr 1.5-1.8 reported in records, unclear if this is his recent baseline however. 3.  Nephrotic range proteinuria of undetermined etiology, however pt with long standing DM-2 4.  Anemia of CKD.  5.  MRSA sepsis on treatment teflaro. 6. 1.7 cm left kidney mass on US, however MRI abd negative.  Plan: Creatinine continued to trend down after the most recent episode of acute renal failure. Creatinine is currently down to 2.09 and BUN is 23.  Patient has received IV fluid hydration. Continue to monitor renal function clinically. Diuretics have been placed on hold. Will continue to follow the patient along with you.    LOS: 0 Marjani Kobel 9/18/20174:33 PM

## 2016-03-24 LAB — BASIC METABOLIC PANEL
ANION GAP: 8 (ref 5–15)
BUN: 23 mg/dL — ABNORMAL HIGH (ref 6–20)
CALCIUM: 9.2 mg/dL (ref 8.9–10.3)
CO2: 26 mmol/L (ref 22–32)
Chloride: 104 mmol/L (ref 101–111)
Creatinine, Ser: 2.29 mg/dL — ABNORMAL HIGH (ref 0.61–1.24)
GFR, EST AFRICAN AMERICAN: 33 mL/min — AB (ref >60)
GFR, EST NON AFRICAN AMERICAN: 28 mL/min — AB (ref >60)
GLUCOSE: 213 mg/dL — AB (ref 65–99)
Potassium: 3.9 mmol/L (ref 3.5–5.1)
Sodium: 138 mmol/L (ref 135–145)

## 2016-05-05 DEATH — deceased

## 2018-05-20 IMAGING — US US RENAL
1 series · 14 of 25 positions shown · non-contrast
Comparison: None.

CLINICAL DATA: Acute kidney injury.  Creatinine 3.04 today.

EXAM:
RENAL / URINARY TRACT ULTRASOUND COMPLETE

[Series 1: us renal · 0.23mm/px · 14 of 41 slices shown]
[im 1/41]
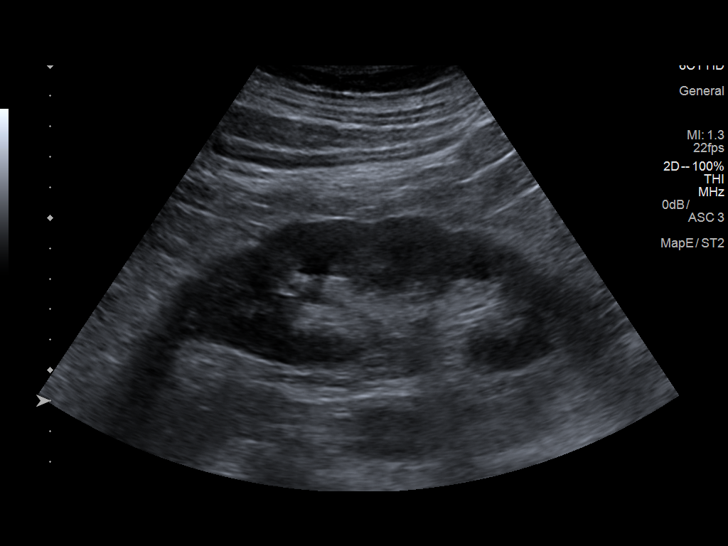
[im 4/41]
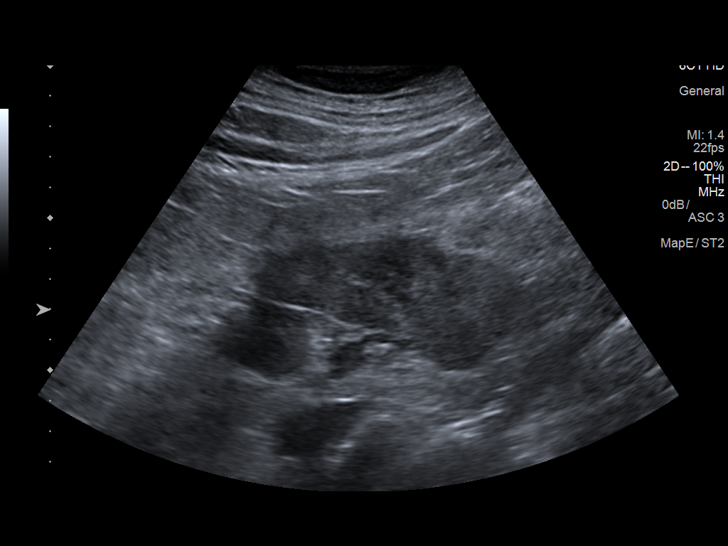
[im 7/41]
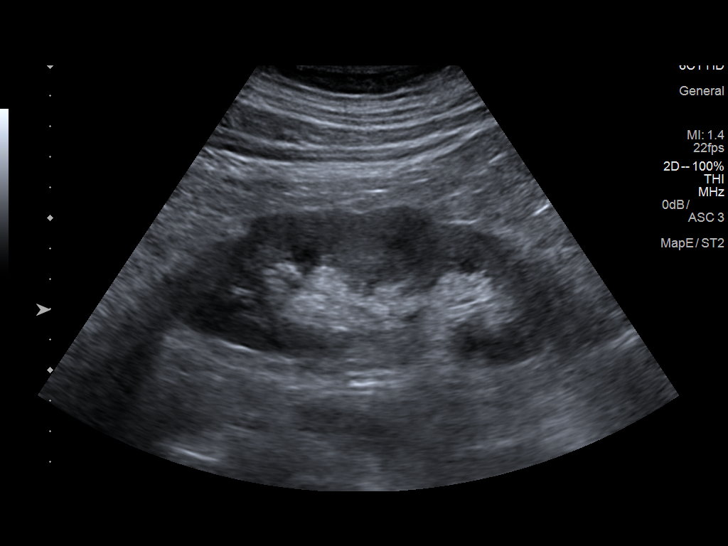
[im 11/41]
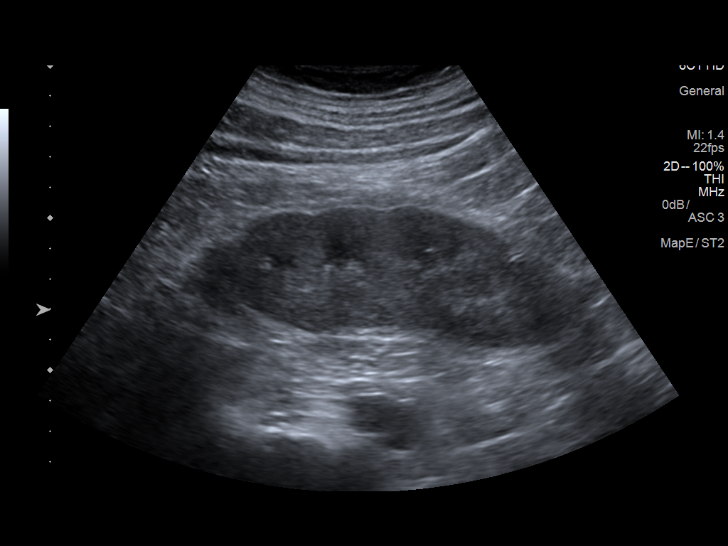
[im 14/41]
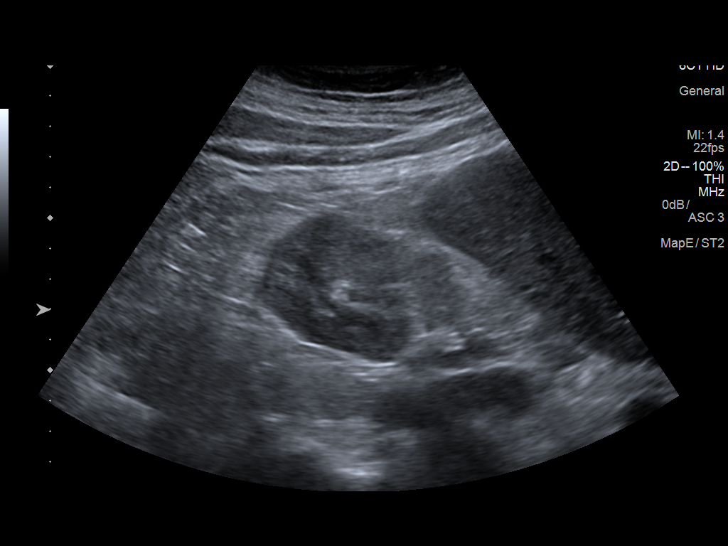
[im 16/41]
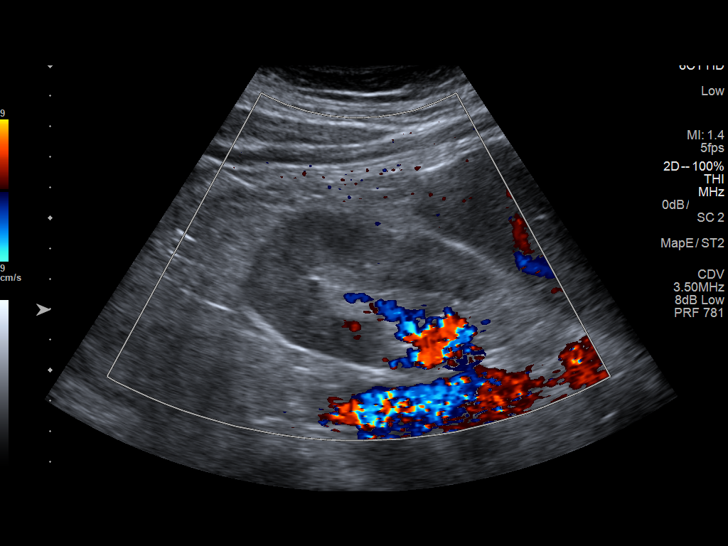
[im 19/41]
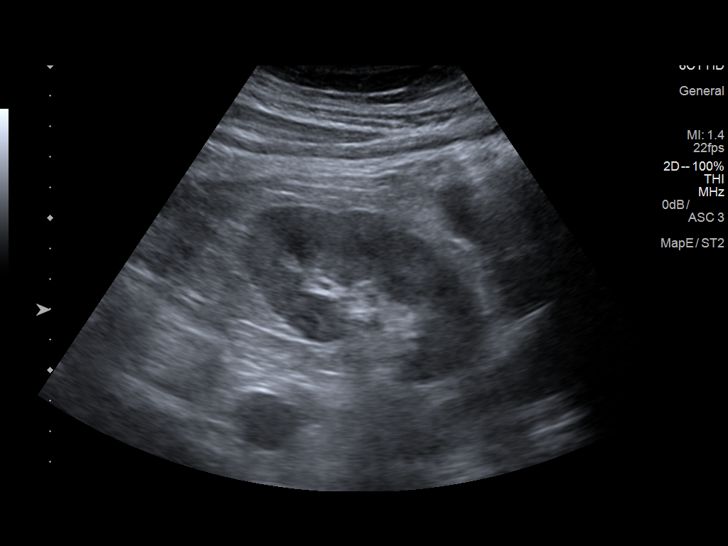
[im 22/41]
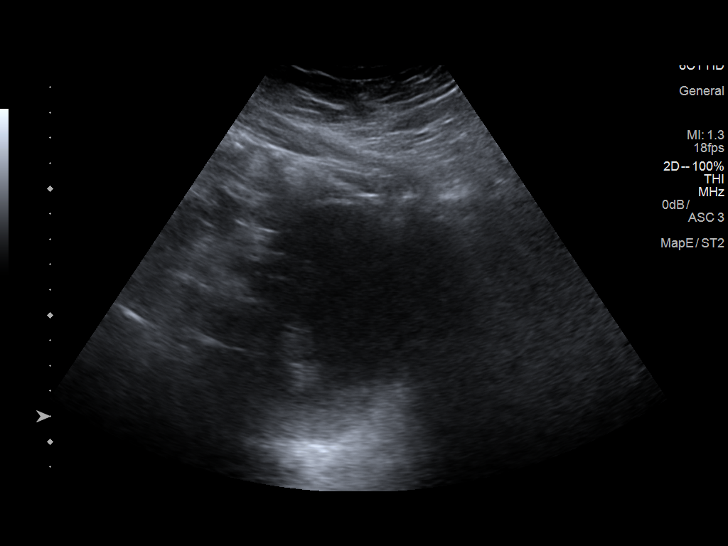
[im 26/41]
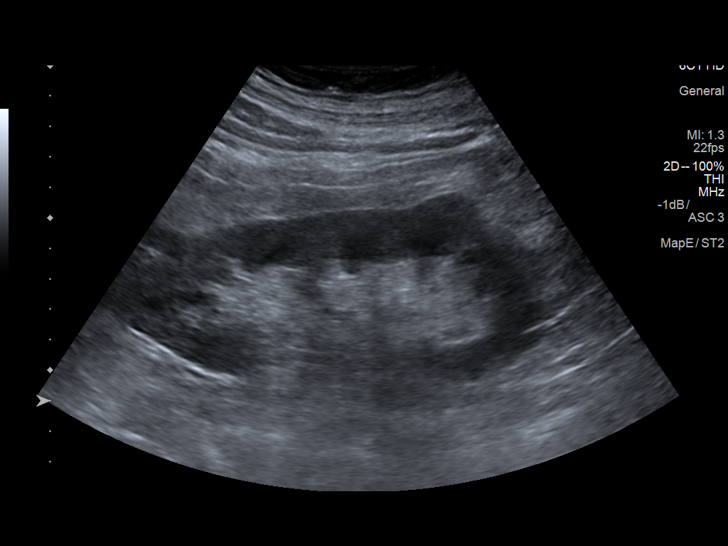
[im 27/41]
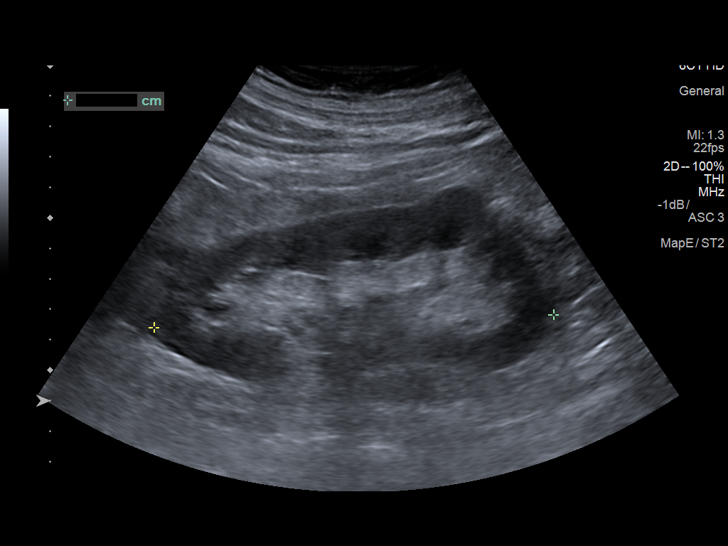
[im 31/41]
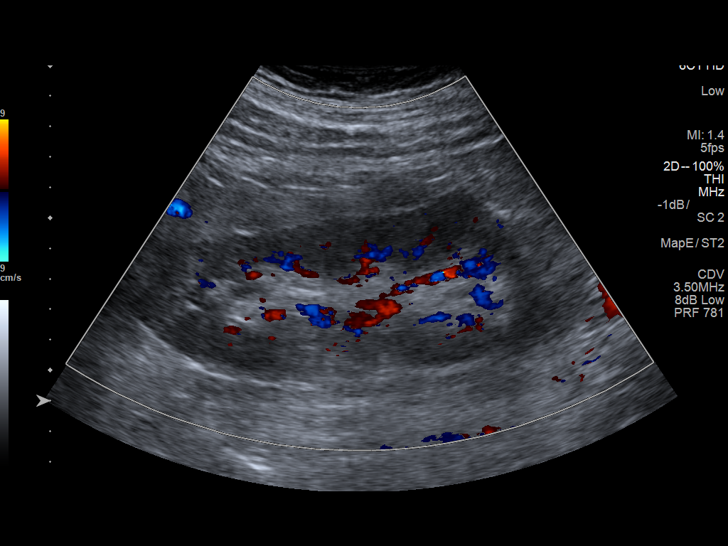
[im 34/41]
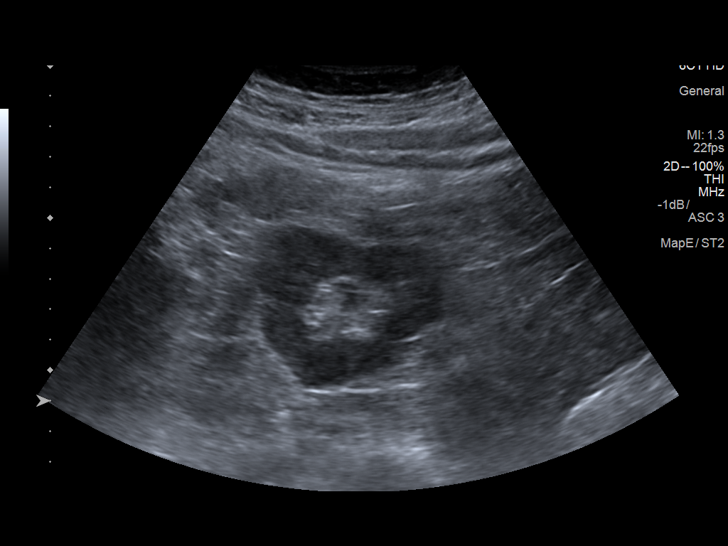
[im 37/41]
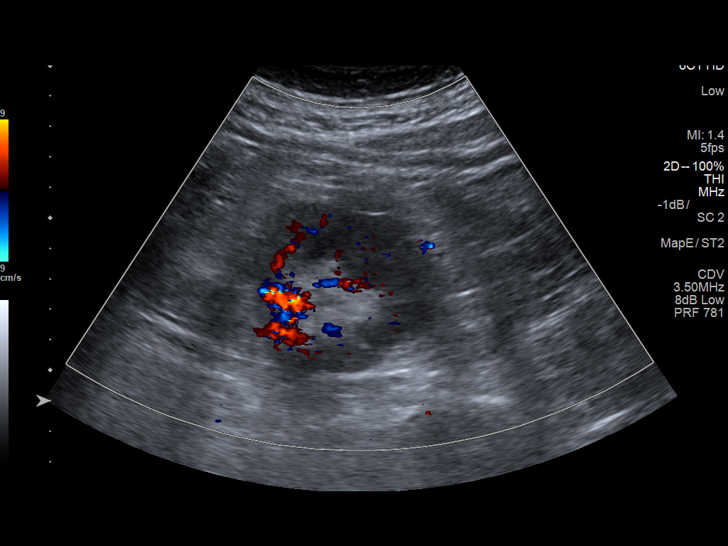
[im 41/41]
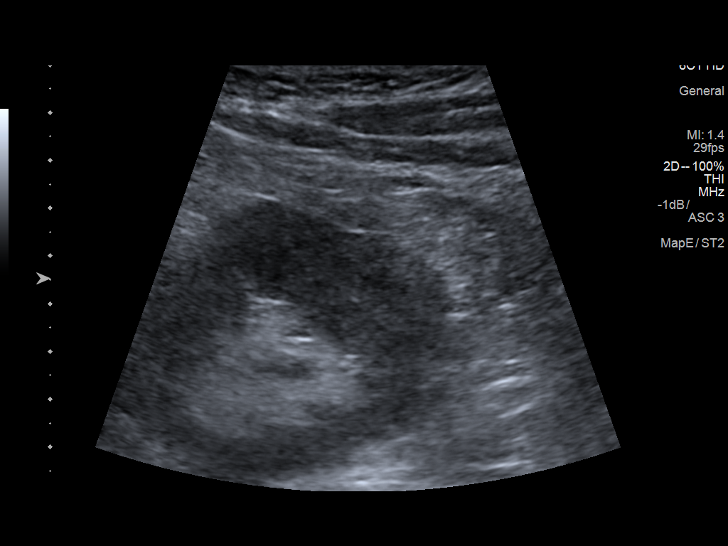

[14 of 25 positions shown; findings below may reference images not displayed]

FINDINGS: Right Kidney:

Length: 12.6 cm. Fetal lobulation. Echogenicity within normal
limits. No mass or hydronephrosis visualized.

Left Kidney:

Length: 13.1 cm. Fetal lobulation. Lung anterior lower pole of the
left kidney, there is a prominent exophytic parenchymal structure.
This measures about 1.7 cm diameter. Appearance is indeterminate.
This could represent a focal lobulation but the configuration is
worrisome for possible solid mass lesion. Suggest follow-up with
elective contrast-enhanced CT or MRI for further evaluation.
Echogenicity within normal limits. No hydronephrosis visualized.

Bladder:

Incomplete distention. No wall thickening or intraluminal filling
defects.
IMPRESSION: Fetal lobulation in both kidneys.  No hydronephrosis.

**An incidental finding of potential clinical significance has been
found. Focal prominent lobulation versus mass in the lower pole left
kidney. Recommend follow-up with elective CT abdomen and pelvis or
MRI for further characterization.**
